# Patient Record
Sex: Female | Born: 1975 | Race: White | Hispanic: No | Marital: Married | Smoking: Former smoker
Health system: Southern US, Community
[De-identification: ages and names within clinical notes are randomized; demographics above are authoritative.]

## PROBLEM LIST (undated history)

## (undated) DIAGNOSIS — B977 Papillomavirus as the cause of diseases classified elsewhere: Secondary | ICD-10-CM

## (undated) DIAGNOSIS — A6000 Herpesviral infection of urogenital system, unspecified: Secondary | ICD-10-CM

## (undated) DIAGNOSIS — K921 Melena: Secondary | ICD-10-CM

## (undated) DIAGNOSIS — F32A Depression, unspecified: Secondary | ICD-10-CM

## (undated) DIAGNOSIS — L409 Psoriasis, unspecified: Secondary | ICD-10-CM

## (undated) DIAGNOSIS — F419 Anxiety disorder, unspecified: Secondary | ICD-10-CM

## (undated) DIAGNOSIS — F329 Major depressive disorder, single episode, unspecified: Secondary | ICD-10-CM

## (undated) HISTORY — DX: Psoriasis, unspecified: L40.9

## (undated) HISTORY — DX: Major depressive disorder, single episode, unspecified: F32.9

## (undated) HISTORY — DX: Anxiety disorder, unspecified: F41.9

## (undated) HISTORY — DX: Herpesviral infection of urogenital system, unspecified: A60.00

## (undated) HISTORY — DX: Depression, unspecified: F32.A

## (undated) HISTORY — PX: HAND TENDON SURGERY: SHX663

## (undated) HISTORY — DX: Melena: K92.1

## (undated) HISTORY — DX: Papillomavirus as the cause of diseases classified elsewhere: B97.7

## (undated) HISTORY — PX: EXCISION VAGINAL CYST: SHX5825

---

## 2014-01-16 ENCOUNTER — Other Ambulatory Visit (HOSPITAL_COMMUNITY)
Admission: RE | Admit: 2014-01-16 | Discharge: 2014-01-16 | Disposition: A | Discharge status: Home / Self Care | Payer: PRIVATE HEALTH INSURANCE | Source: Ambulatory Visit | Attending: Family Medicine | Admitting: Family Medicine

## 2014-01-16 ENCOUNTER — Encounter (INDEPENDENT_AMBULATORY_CARE_PROVIDER_SITE_OTHER): Payer: Self-pay

## 2014-01-16 ENCOUNTER — Encounter: Payer: Self-pay | Admitting: Family Medicine

## 2014-01-16 ENCOUNTER — Ambulatory Visit (INDEPENDENT_AMBULATORY_CARE_PROVIDER_SITE_OTHER): Payer: PRIVATE HEALTH INSURANCE | Admitting: Family Medicine

## 2014-01-16 VITALS — BP 102/62 | HR 83 | Temp 98.2°F | Ht 68.0 in | Wt 140.5 lb

## 2014-01-16 DIAGNOSIS — Z01419 Encounter for gynecological examination (general) (routine) without abnormal findings: Secondary | ICD-10-CM | POA: Insufficient documentation

## 2014-01-16 DIAGNOSIS — Z113 Encounter for screening for infections with a predominantly sexual mode of transmission: Secondary | ICD-10-CM | POA: Insufficient documentation

## 2014-01-16 DIAGNOSIS — Z0000 Encounter for general adult medical examination without abnormal findings: Secondary | ICD-10-CM

## 2014-01-16 DIAGNOSIS — Z1151 Encounter for screening for human papillomavirus (HPV): Secondary | ICD-10-CM | POA: Diagnosis present

## 2014-01-16 DIAGNOSIS — Z124 Encounter for screening for malignant neoplasm of cervix: Secondary | ICD-10-CM | POA: Diagnosis not present

## 2014-01-16 DIAGNOSIS — Z23 Encounter for immunization: Secondary | ICD-10-CM

## 2014-01-16 DIAGNOSIS — N760 Acute vaginitis: Secondary | ICD-10-CM | POA: Diagnosis present

## 2014-01-16 DIAGNOSIS — Z13220 Encounter for screening for lipoid disorders: Secondary | ICD-10-CM

## 2014-01-16 DIAGNOSIS — R5383 Other fatigue: Secondary | ICD-10-CM

## 2014-01-16 DIAGNOSIS — Z0189 Encounter for other specified special examinations: Secondary | ICD-10-CM

## 2014-01-16 LAB — CBC WITH DIFFERENTIAL/PLATELET
Basophils Absolute: 0 10*3/uL (ref 0.0–0.1)
Basophils Relative: 0.6 % (ref 0.0–3.0)
EOS ABS: 0.1 10*3/uL (ref 0.0–0.7)
Eosinophils Relative: 2.8 % (ref 0.0–5.0)
HEMATOCRIT: 42.4 % (ref 36.0–46.0)
HEMOGLOBIN: 14.4 g/dL (ref 12.0–15.0)
LYMPHS ABS: 1.2 10*3/uL (ref 0.7–4.0)
Lymphocytes Relative: 26.5 % (ref 12.0–46.0)
MCHC: 33.9 g/dL (ref 30.0–36.0)
MCV: 91.4 fl (ref 78.0–100.0)
MONOS PCT: 12.6 % — AB (ref 3.0–12.0)
Monocytes Absolute: 0.6 10*3/uL (ref 0.1–1.0)
NEUTROS ABS: 2.7 10*3/uL (ref 1.4–7.7)
Neutrophils Relative %: 57.5 % (ref 43.0–77.0)
Platelets: 292 10*3/uL (ref 150.0–400.0)
RBC: 4.64 Mil/uL (ref 3.87–5.11)
RDW: 12.8 % (ref 11.5–15.5)
WBC: 4.6 10*3/uL (ref 4.0–10.5)

## 2014-01-16 LAB — LIPID PANEL
Cholesterol: 146 mg/dL (ref 0–200)
HDL: 63.3 mg/dL (ref >39.00)
LDL Cholesterol: 72 mg/dL (ref 0–99)
NONHDL: 82.7
TRIGLYCERIDES: 55 mg/dL (ref 0.0–149.0)
Total CHOL/HDL Ratio: 2
VLDL: 11 mg/dL (ref 0.0–40.0)

## 2014-01-16 LAB — VITAMIN D 25 HYDROXY (VIT D DEFICIENCY, FRACTURES): VITD: 36.84 ng/mL (ref 30.00–100.00)

## 2014-01-16 LAB — COMPREHENSIVE METABOLIC PANEL
ALT: 11 U/L (ref 0–35)
AST: 19 U/L (ref 0–37)
Albumin: 4.7 g/dL (ref 3.5–5.2)
Alkaline Phosphatase: 63 U/L (ref 39–117)
BILIRUBIN TOTAL: 1.2 mg/dL (ref 0.2–1.2)
BUN: 11 mg/dL (ref 6–23)
CO2: 28 meq/L (ref 19–32)
CREATININE: 0.8 mg/dL (ref 0.4–1.2)
Calcium: 9.1 mg/dL (ref 8.4–10.5)
Chloride: 105 mEq/L (ref 96–112)
GFR: 91.44 mL/min (ref >60.00)
Glucose, Bld: 80 mg/dL (ref 70–99)
Potassium: 3.6 mEq/L (ref 3.5–5.1)
Sodium: 136 mEq/L (ref 135–145)
Total Protein: 8.1 g/dL (ref 6.0–8.3)

## 2014-01-16 LAB — VITAMIN B12: VITAMIN B 12: 355 pg/mL (ref 211–911)

## 2014-01-16 LAB — TSH: TSH: 0.53 u[IU]/mL (ref 0.35–4.50)

## 2014-01-16 NOTE — Progress Notes (Signed)
Subjective:   Patient ID: Tricia Small, female    DOB: Apr 27, 1975, 38 y.o.   MRN: [id number]  Tricia Small is a pleasant 38 y.o. year old female who presents to clinic today with Establish Care and Annual Exam  on 01/16/2014  HPI: G1P1 here to establish care and would like CPX with pap today.  H/o abnormal pap smear in 2003.   Had LEEP at that time, no abnormal pap smears since. Last pap smear done in 2011. Has a 61 year old son. Sexually active with husband only- they are considering starting a family soon.  Fatigue- has been more fatigued lately, noticing hair loss as well. No changes in her bowel habits. Very physically active- denies exercise intolerance. Denies feeling anxious.  Does have some "seasonal blues" but says she is not depressed.  No current outpatient prescriptions on file prior to visit.   No current facility-administered medications on file prior to visit.    Allergies  Allergen Reactions  . Penicillins Hives    Past Medical History  Diagnosis Date  . Blood in stool     after childbirth  . Depression   . HPV (human papilloma virus) infection   . Genital herpes   . Psoriasis     Past Surgical History  Procedure Laterality Date  . Hand tendon surgery      L thumb  . Excision vaginal cyst      Family History  Problem Relation Age of Onset  . Alcohol abuse Father   . Hypertension Father   . Diabetes Maternal Grandmother   . Cancer Maternal Grandfather   . Cancer Paternal Grandmother   . Diabetes Paternal Grandfather     History   Social History  . Marital Status: Married    Spouse Name: N/A    Number of Children: N/A  . Years of Education: N/A   Occupational History  . Not on file.   Social History Main Topics  . Smoking status: Former Games developer  . Smokeless tobacco: Never Used  . Alcohol Use: Yes  . Drug Use: No  . Sexual Activity: Yes   Other Topics Concern  . Not on file   Social History Narrative  . No narrative on  file   The PMH, PSH, Social History, Family History, Medications, and allergies have been reviewed in [facility], and have been updated if relevant.   Review of Systems  Constitutional: Positive for fatigue. Negative for fever, chills, activity change, appetite change and unexpected weight change.  Eyes: Negative.   Respiratory: Negative.   Cardiovascular: Negative.   Gastrointestinal: Negative.   Endocrine: Negative.   Genitourinary: Negative.   Musculoskeletal: Negative.   Neurological: Negative.   Hematological: Negative.   All other systems reviewed and are negative.      Objective:    BP 102/62  Pulse 83  Temp(Src) 98.2 F (36.8 C) (Oral)  Ht 5\' 8"  (1.727 m)  Wt 140 lb 8 oz (63.73 kg)  BMI 21.37 kg/m2  SpO2 98%  LMP 12/22/2013   Physical Exam    General:  Well-developed,well-nourished,in no acute distress; alert,appropriate and cooperative throughout examination Head:  normocephalic and atraumatic.   Eyes:  vision grossly intact, pupils equal, pupils round, and pupils reactive to light.   Ears:  R ear normal and L ear normal.   Nose:  no external deformity.   Mouth:  good dentition.   Neck:  No deformities, masses, or tenderness noted. Breasts:  No mass, nodules, thickening, tenderness, bulging,  retraction, inflamation, nipple discharge or skin changes noted.   Lungs:  Normal respiratory effort, chest expands symmetrically. Lungs are clear to auscultation, no crackles or wheezes. Heart:  Normal rate and regular rhythm. S1 and S2 normal without gallop, murmur, click, rub or other extra sounds. Abdomen:  Bowel sounds positive,abdomen soft and non-tender without masses, organomegaly or hernias noted. Rectal:  no external abnormalities.   Genitalia:  Pelvic Exam:        External: normal female genitalia without lesions or masses        Vagina: normal without lesions or masses        Cervix: normal without lesions or masses        Adnexa: normal bimanual exam without  masses or fullness        Uterus: normal by palpation        Pap smear: performed Msk:  No deformity or scoliosis noted of thoracic or lumbar spine.   Extremities:  No clubbing, cyanosis, edema, or deformity noted with normal full range of motion of all joints.   Neurologic:  alert & oriented X3 and gait normal.   Skin:  Intact without suspicious lesions or rashes Cervical Nodes:  No lymphadenopathy noted Axillary Nodes:  No palpable lymphadenopathy Psych:  Cognition and judgment appear intact. Alert and cooperative with normal attention span and concentration. No apparent delusions, illusions, hallucinations      Assessment & Plan:   Encounter for routine gynecological examination  Encounter for wellness examination No Follow-up on file.

## 2014-01-16 NOTE — Assessment & Plan Note (Signed)
Pap smear done today

## 2014-01-16 NOTE — Patient Instructions (Signed)
It was nice to meet you. We will call you with your results and you can view them online.

## 2014-01-16 NOTE — Assessment & Plan Note (Signed)
Reviewed preventive care protocols, scheduled due services, and updated immunizations Discussed nutrition, exercise, diet, and healthy lifestyle. Orders Placed This Encounter  Procedures  . Flu Vaccine QUAD 36+ mos PF IM (Fluarix Quad PF)  . CBC with Differential  . Comprehensive metabolic panel  . Lipid panel  . TSH  . HIV antibody (with reflex)  . RPR  . HSV(herpes simplex vrs) 1+2 ab-IgM  . Vitamin B12  . Vitamin D, 25-hydroxy

## 2014-01-16 NOTE — Progress Notes (Signed)
Pre visit review using our clinic review tool, if applicable. No additional management support is needed unless otherwise documented below in the visit note. 

## 2014-01-16 NOTE — Assessment & Plan Note (Signed)
Likely multifactorial- busy lifestyle is contributing. Sleeping well. No red flag symptoms. Will check labs today to rule out possible contributing factors.

## 2014-01-17 LAB — RPR

## 2014-01-17 LAB — CYTOLOGY - PAP

## 2014-01-17 LAB — HIV ANTIBODY (ROUTINE TESTING W REFLEX): HIV 1&2 Ab, 4th Generation: NONREACTIVE

## 2014-01-20 ENCOUNTER — Encounter: Payer: Self-pay | Admitting: *Deleted

## 2014-01-20 LAB — HSV(HERPES SIMPLEX VRS) I + II AB-IGM: HERPES SIMPLEX VRS I-IGM AB (EIA): 0.51 {index}

## 2014-01-21 LAB — CERVICOVAGINAL ANCILLARY ONLY
BACTERIAL VAGINITIS: NEGATIVE
Candida vaginitis: NEGATIVE
Herpes: NEGATIVE

## 2014-02-19 ENCOUNTER — Encounter: Payer: Self-pay | Admitting: Family Medicine

## 2014-03-11 NOTE — Telephone Encounter (Signed)
Dr. Dayton MartesAron, please see pt's email below. She is wanting codes changed and I can't change them.  Please advise. Thank you.

## 2015-12-22 ENCOUNTER — Other Ambulatory Visit: Payer: Self-pay | Admitting: Family Medicine

## 2015-12-22 ENCOUNTER — Telehealth: Payer: Self-pay | Admitting: Family Medicine

## 2015-12-22 DIAGNOSIS — Z1231 Encounter for screening mammogram for malignant neoplasm of breast: Secondary | ICD-10-CM

## 2015-12-22 NOTE — Telephone Encounter (Signed)
Pt would like to have back to back appt with her husband, for cpe ( she also wants a pap) and she is requesting mon, wed or fri, early in the am.  Is this possible? cb number is [phone] Thanks

## 2015-12-23 NOTE — Telephone Encounter (Signed)
She could come the 20th or the 25th, but it wont be until appx 10. All early morning slots have been filled

## 2016-01-13 ENCOUNTER — Ambulatory Visit: Payer: PRIVATE HEALTH INSURANCE

## 2016-01-27 ENCOUNTER — Other Ambulatory Visit (HOSPITAL_COMMUNITY)
Admission: RE | Admit: 2016-01-27 | Discharge: 2016-01-27 | Disposition: A | Discharge status: Home / Self Care | Payer: 59 | Source: Ambulatory Visit | Attending: Family Medicine | Admitting: Family Medicine

## 2016-01-27 ENCOUNTER — Encounter: Payer: Self-pay | Admitting: Family Medicine

## 2016-01-27 ENCOUNTER — Ambulatory Visit (INDEPENDENT_AMBULATORY_CARE_PROVIDER_SITE_OTHER): Payer: 59 | Admitting: Family Medicine

## 2016-01-27 VITALS — BP 104/62 | HR 86 | Temp 98.0°F | Ht 68.5 in | Wt 135.8 lb

## 2016-01-27 DIAGNOSIS — Z0000 Encounter for general adult medical examination without abnormal findings: Secondary | ICD-10-CM

## 2016-01-27 DIAGNOSIS — Z1151 Encounter for screening for human papillomavirus (HPV): Secondary | ICD-10-CM | POA: Insufficient documentation

## 2016-01-27 DIAGNOSIS — Z23 Encounter for immunization: Secondary | ICD-10-CM

## 2016-01-27 DIAGNOSIS — Z01419 Encounter for gynecological examination (general) (routine) without abnormal findings: Secondary | ICD-10-CM

## 2016-01-27 LAB — CBC WITH DIFFERENTIAL/PLATELET
Basophils Absolute: 0 10*3/uL (ref 0.0–0.1)
Basophils Relative: 0.3 % (ref 0.0–3.0)
EOS ABS: 0.1 10*3/uL (ref 0.0–0.7)
Eosinophils Relative: 1.7 % (ref 0.0–5.0)
HCT: 41.8 % (ref 36.0–46.0)
HEMOGLOBIN: 14 g/dL (ref 12.0–15.0)
LYMPHS PCT: 28.6 % (ref 12.0–46.0)
Lymphs Abs: 1.4 10*3/uL (ref 0.7–4.0)
MCHC: 33.6 g/dL (ref 30.0–36.0)
MCV: 89.2 fl (ref 78.0–100.0)
MONO ABS: 0.4 10*3/uL (ref 0.1–1.0)
Monocytes Relative: 7.3 % (ref 3.0–12.0)
NEUTROS ABS: 3.1 10*3/uL (ref 1.4–7.7)
Neutrophils Relative %: 62.1 % (ref 43.0–77.0)
PLATELETS: 331 10*3/uL (ref 150.0–400.0)
RBC: 4.69 Mil/uL (ref 3.87–5.11)
RDW: 12.4 % (ref 11.5–15.5)
WBC: 4.9 10*3/uL (ref 4.0–10.5)

## 2016-01-27 LAB — LIPID PANEL
CHOL/HDL RATIO: 2
Cholesterol: 154 mg/dL (ref 0–200)
HDL: 61.6 mg/dL (ref >39.00)
LDL CALC: 83 mg/dL (ref 0–99)
NONHDL: 91.94
TRIGLYCERIDES: 43 mg/dL (ref 0.0–149.0)
VLDL: 8.6 mg/dL (ref 0.0–40.0)

## 2016-01-27 LAB — COMPREHENSIVE METABOLIC PANEL
ALT: 15 U/L (ref 0–35)
AST: 18 U/L (ref 0–37)
Albumin: 4.3 g/dL (ref 3.5–5.2)
Alkaline Phosphatase: 113 U/L (ref 39–117)
BILIRUBIN TOTAL: 0.9 mg/dL (ref 0.2–1.2)
BUN: 12 mg/dL (ref 6–23)
CHLORIDE: 102 meq/L (ref 96–112)
CO2: 29 meq/L (ref 19–32)
CREATININE: 0.69 mg/dL (ref 0.40–1.20)
Calcium: 9.5 mg/dL (ref 8.4–10.5)
GFR: 99.65 mL/min (ref >60.00)
Glucose, Bld: 87 mg/dL (ref 70–99)
Potassium: 4 mEq/L (ref 3.5–5.1)
SODIUM: 139 meq/L (ref 135–145)
Total Protein: 7.1 g/dL (ref 6.0–8.3)

## 2016-01-27 LAB — TSH: TSH: 1.3 u[IU]/mL (ref 0.35–4.50)

## 2016-01-27 NOTE — Progress Notes (Signed)
Subjective:   Patient ID: Tricia Small, female    DOB: 06/09/1975, 40 y.o.   MRN: [id number]  Tricia Napllyson Puls is a pleasant 40 y.o. year old female who presents to clinic today with Annual Exam (with pap)  on 01/27/2016  HPI: [id number]- her 717 month old son, Lambert Ketolonzo is doing well.  Truddie HiddenLou, her oldest, is now 5 and just started kindergarten.  H/o abnormal pap smear in 2003.   Had LEEP at that time, no abnormal pap smears since. She thinks last pap was done by me in 2015 and OB did not do one when she was pregnant.  She is currently breast feeding.  Has never had a mammogram.  Sexually active with husband only.   Very physically active- denies exercise intolerance.  Denies feeling anxious.  Does have some "seasonal blues" but says she is not depressed.  No current outpatient prescriptions on file prior to visit.   No current facility-administered medications on file prior to visit.     Allergies  Allergen Reactions  . Penicillins Hives    Past Medical History:  Diagnosis Date  . Blood in stool    after childbirth  . Depression   . Genital herpes   . HPV (human papilloma virus) infection   . Psoriasis     Past Surgical History:  Procedure Laterality Date  . EXCISION VAGINAL CYST    . HAND TENDON SURGERY     L thumb    Family History  Problem Relation Age of Onset  . Alcohol abuse Father   . Hypertension Father   . Diabetes Maternal Grandmother   . Cancer Maternal Grandfather   . Cancer Paternal Grandmother   . Diabetes Paternal Grandfather     Social History   Social History  . Marital status: Married    Spouse name: N/A  . Number of children: N/A  . Years of education: N/A   Occupational History  . Not on file.   Social History Main Topics  . Smoking status: Former Games developermoker  . Smokeless tobacco: Never Used  . Alcohol use Yes  . Drug use: No  . Sexual activity: Yes   Other Topics Concern  . Not on file   Social History Narrative  . No narrative  on file   The PMH, PSH, Social History, Family History, Medications, and allergies [facility], and have been updated if relevant.   Review of Systems  Constitutional: Positive for fatigue. Negative for activity change, appetite change, chills, fever and unexpected weight change.  Eyes: Negative.   Respiratory: Negative.   Cardiovascular: Negative.   Gastrointestinal: Negative.   Endocrine: Negative.   Genitourinary: Negative.   Musculoskeletal: Negative.   Neurological: Negative.   Hematological: Negative.   All other systems reviewed and are negative.      Objective:    BP 104/62   Pulse 86   Temp 98 F (36.7 C) (Oral)   Ht 5' 8.5" (1.74 m)   Wt 135 lb 12 oz (61.6 kg)   LMP 01/21/2016   SpO2 98%   BMI 20.34 kg/m    Physical Exam  General:  Well-developed,well-nourished,in no acute distress; alert,appropriate and cooperative throughout examination Head:  normocephalic and atraumatic.   Eyes:  vision grossly intact, pupils equal, pupils round, and pupils reactive to light.   Ears:  R ear normal and L ear normal.   Nose:  no external deformity.   Mouth:  good dentition.   Neck:  No deformities,  masses, or tenderness noted. Breasts:  No mass, nodules, thickening, tenderness, bulging, retraction, inflamation, nipple discharge or skin changes noted.  Lungs:  Normal respiratory effort, chest expands symmetrically. Lungs are clear to auscultation, no crackles or wheezes. Heart:  Normal rate and regular rhythm. S1 and S2 normal without gallop, murmur, click, rub or other extra sounds. Abdomen:  Bowel sounds positive,abdomen soft and non-tender without masses, organomegaly or hernias noted. Rectal:  no external abnormalities.   Genitalia:  Pelvic Exam:        External: normal female genitalia without lesions or masses        Vagina: normal without lesions or masses        Cervix: normal without lesions or masses        Adnexa: normal bimanual exam without  masses or fullness        Uterus: normal by palpation        Pap smear: performed Msk:  No deformity or scoliosis noted of thoracic or lumbar spine.   Extremities:  No clubbing, cyanosis, edema, or deformity noted with normal full range of motion of all joints.   Neurologic:  alert & oriented X3 and gait normal.   Skin:  Intact without suspicious lesions or rashes Cervical Nodes:  No lymphadenopathy noted Axillary Nodes:  No palpable lymphadenopathy Psych:  Cognition and judgment appear intact. Alert and cooperative with normal attention span and concentration. No apparent delusions, illusions, hallucinations       Assessment & Plan:   Encounter for wellness examination - Plan: CBC with Differential/Platelet, Comprehensive metabolic panel, Lipid panel, TSH  Encounter for gynecological examination without abnormal finding No Follow-up on file.

## 2016-01-27 NOTE — Progress Notes (Signed)
Pre visit review using our clinic review tool, if applicable. No additional management support is needed unless otherwise documented below in the visit note. 

## 2016-01-27 NOTE — Assessment & Plan Note (Addendum)
Pap smear done today

## 2016-01-27 NOTE — Patient Instructions (Signed)
Great to see you. Happy birthday!  We will call you with your lab results and you can view them online. 

## 2016-01-27 NOTE — Assessment & Plan Note (Signed)
Reviewed preventive care protocols, scheduled due services, and updated immunizations Discussed nutrition, exercise, diet, and healthy lifestyle.  

## 2016-01-27 NOTE — Addendum Note (Signed)
Addended by: Desmond DikeKNIGHT, Puanani Gene H on: 01/27/2016 08:53 AM   Modules accepted: Orders

## 2016-01-29 LAB — CYTOLOGY - PAP

## 2016-02-01 ENCOUNTER — Encounter: Payer: Self-pay | Admitting: *Deleted

## 2016-03-29 ENCOUNTER — Encounter: Payer: Self-pay | Admitting: Family Medicine

## 2016-03-30 ENCOUNTER — Encounter: Payer: Self-pay | Admitting: *Deleted

## 2016-05-06 ENCOUNTER — Ambulatory Visit (INDEPENDENT_AMBULATORY_CARE_PROVIDER_SITE_OTHER): Payer: 59 | Admitting: Family Medicine

## 2016-05-06 ENCOUNTER — Encounter: Payer: Self-pay | Admitting: Family Medicine

## 2016-05-06 VITALS — BP 112/60 | HR 81 | Temp 99.0°F | Wt 147.8 lb

## 2016-05-06 DIAGNOSIS — H9202 Otalgia, left ear: Secondary | ICD-10-CM | POA: Diagnosis not present

## 2016-05-06 NOTE — Progress Notes (Signed)
Subjective:    Patient ID: Tricia Small, female    DOB: 04/09/1976, 41 y.o.   MRN: [id number]  HPI This is a 41 yo female who presents today with left ear pain x 5-6 days. Has a history of excessive wax and has used over the counter ear wax removal products with gradual relief. She has been applying warm packs to ear with some relief. She is currently [redacted] weeks pregnant and is breastfeeding her 4710 month old. She strongly prefers to not use antibiotics or systemic medications including acetaminophen. Her 735 yo son has recently been in the hospital from complications of flu (pharyngeal abscess). The patient denies fever or chills, has had clear nasal drainage for a couple of days. No myalgias. No headache. No cough. She had some nausea early in her pregnancy, but none recently. Good appetite. Taking daily otc prenatal vitamin. Has ob care.   Past Medical History:  Diagnosis Date  . Blood in stool    after childbirth  . Depression   . Genital herpes   . HPV (human papilloma virus) infection   . Psoriasis    Past Surgical History:  Procedure Laterality Date  . EXCISION VAGINAL CYST    . HAND TENDON SURGERY     L thumb   Family History  Problem Relation Age of Onset  . Alcohol abuse Father   . Hypertension Father   . Diabetes Maternal Grandmother   . Cancer Maternal Grandfather   . Cancer Paternal Grandmother   . Diabetes Paternal Grandfather    Social History  Substance Use Topics  . Smoking status: Former Games developermoker  . Smokeless tobacco: Never Used  . Alcohol use Yes     Review of Systems Per HPI    Objective:   Physical Exam  Constitutional: She is oriented to person, place, and time. She appears well-developed and well-nourished. No distress.  HENT:  Head: Normocephalic and atraumatic.  Right Ear: Tympanic membrane, external ear and ear canal normal.  Left Ear: Tympanic membrane, external ear and ear canal normal.  Nose: Rhinorrhea present. Right sinus exhibits no  maxillary sinus tenderness and no frontal sinus tenderness. Left sinus exhibits no maxillary sinus tenderness and no frontal sinus tenderness.  Mouth/Throat: Oropharynx is clear and moist.  Moderate amount flaky, light yellow cerumen. Part of TM visible is shiny grey, no erythema. No swelling of canal, mildly uncomfortable to exam. No preauricular tenderness or pain with opening/closing of jaw. Auricular tubercle with small area erythema- patient reports this is new, possibly from applying heat to area.   Eyes: Conjunctivae are normal.  Neck: Normal range of motion. Neck supple.  Cardiovascular: Normal rate, regular rhythm and normal heart sounds.   Pulmonary/Chest: Effort normal and breath sounds normal.  Lymphadenopathy:    She has no cervical adenopathy.  Neurological: She is alert and oriented to person, place, and time.  Skin: Skin is warm and dry. She is not diaphoretic.  Psychiatric: She has a normal mood and affect. Her behavior is normal. Judgment and thought content normal.  Vitals reviewed.        BP 112/60 (BP Location: Right Arm, Patient Position: Sitting, Cuff Size: Normal)   Pulse 81   Temp 99 F (37.2 C) (Oral)   Wt 147 lb 12.8 oz (67 kg)   LMP 02/21/2016   SpO2 97%   BMI 22.15 kg/m  Wt Readings from Last 3 Encounters:  05/06/16 147 lb 12.8 oz (67 kg)  01/27/16 135 lb 12 oz (61.6  kg)  01/16/14 140 lb 8 oz (63.7 kg)    Assessment & Plan:  1. Otalgia, left ear - Provided written and verbal information regarding diagnosis and treatment. - patient with moderate cerumen, encouraged her to use small amount mineral oil daily for next several days to loosen, can continue warm compresses, discussed risks vs benefits of low dose acetaminophen use during first trimester of pregnancy - RTC precautions reviewed- worsening pain, fever  Olean Ree, FNP-BC  [facility], [facility]  05/06/2016 9:09 PM

## 2016-05-06 NOTE — Patient Instructions (Signed)
Try a drop of mineral oil every week For irrigation can use Elephant Ear system  Earache, Adult An earache, or ear pain, can be caused by many things, including:  An infection.  Ear wax buildup.  Ear pressure.  Something in the ear that should not be there (foreign body).  A sore throat.  Tooth problems.  Jaw problems. Treatment of the earache will depend on the cause. If the cause is not clear or cannot be determined, you may need to watch your symptoms until your earache goes away or until a cause is found. Follow these instructions at home: Pay attention to any changes in your symptoms. Take these actions to help with your pain:  Take or apply over-the-counter and prescription medicines only as told by your health care provider.  If you were prescribed an antibiotic medicine, use it as told by your health care provider. Do not stop using the antibiotic even if you start to feel better.  Do not put anything in your ear other than medicine that is prescribed by your health care provider.  If directed, apply heat to the affected area as often as told by your health care provider. Use the heat source that your health care provider recommends, such as a moist heat pack or a heating pad.  Place a towel between your skin and the heat source.  Leave the heat on for 20-30 minutes.  Remove the heat if your skin turns bright red. This is especially important if you are unable to feel pain, heat, or cold. You may have a greater risk of getting burned.  If directed, put ice on the ear:  Put ice in a plastic bag.  Place a towel between your skin and the bag.  Leave the ice on for 20 minutes, 2-3 times a day.  Try resting in an upright position instead of lying down. This may help to reduce pressure in your ear and relieve pain.  Chew gum if it helps to relieve your ear pain.  Treat any allergies as told by your health care provider.  Keep all follow-up visits as told by your  health care provider. This is important. Contact a health care provider if:  Your pain does not improve within 2 days.  Your earache gets worse.  You have new symptoms.  You have a fever. Get help right away if:  You have a severe headache.  You have a stiff neck.  You have trouble swallowing.  You have redness or swelling behind your ear.  You have fluid or blood coming from your ear.  You have hearing loss.  You feel dizzy. This information is not intended to replace advice given to you by your health care provider. Make sure you discuss any questions you have with your health care provider. Document Released: 11/20/2003 Document Revised: 12/01/2015 Document Reviewed: 09/28/2015 Elsevier Interactive Patient Education  2017 [employer]

## 2016-05-06 NOTE — Progress Notes (Signed)
Pre visit review using our clinic review tool, if applicable. No additional management support is needed unless otherwise documented below in the visit note. 

## 2017-01-19 ENCOUNTER — Encounter: Payer: 59 | Admitting: Family Medicine

## 2017-01-19 ENCOUNTER — Ambulatory Visit (INDEPENDENT_AMBULATORY_CARE_PROVIDER_SITE_OTHER): Payer: 59 | Admitting: Family Medicine

## 2017-01-19 ENCOUNTER — Encounter: Payer: Self-pay | Admitting: Family Medicine

## 2017-01-19 VITALS — BP 110/64 | HR 67 | Temp 97.9°F | Ht 68.0 in | Wt 160.0 lb

## 2017-01-19 DIAGNOSIS — Z23 Encounter for immunization: Secondary | ICD-10-CM | POA: Diagnosis not present

## 2017-01-19 DIAGNOSIS — Z0000 Encounter for general adult medical examination without abnormal findings: Secondary | ICD-10-CM

## 2017-01-19 LAB — CBC WITH DIFFERENTIAL/PLATELET
Basophils Absolute: 0 10*3/uL (ref 0.0–0.1)
Basophils Relative: 0.5 % (ref 0.0–3.0)
EOS ABS: 0.1 10*3/uL (ref 0.0–0.7)
Eosinophils Relative: 2.2 % (ref 0.0–5.0)
HEMATOCRIT: 39.7 % (ref 36.0–46.0)
HEMOGLOBIN: 13.4 g/dL (ref 12.0–15.0)
LYMPHS PCT: 25.6 % (ref 12.0–46.0)
Lymphs Abs: 1.7 10*3/uL (ref 0.7–4.0)
MCHC: 33.7 g/dL (ref 30.0–36.0)
MCV: 91.1 fl (ref 78.0–100.0)
MONO ABS: 0.4 10*3/uL (ref 0.1–1.0)
Monocytes Relative: 6.9 % (ref 3.0–12.0)
Neutro Abs: 4.2 10*3/uL (ref 1.4–7.7)
Neutrophils Relative %: 64.8 % (ref 43.0–77.0)
Platelets: 319 10*3/uL (ref 150.0–400.0)
RBC: 4.35 Mil/uL (ref 3.87–5.11)
RDW: 11.4 % — ABNORMAL LOW (ref 11.5–15.5)
WBC: 6.5 10*3/uL (ref 4.0–10.5)

## 2017-01-19 LAB — LIPID PANEL
CHOLESTEROL: 161 mg/dL (ref 0–200)
HDL: 53.6 mg/dL (ref >39.00)
LDL Cholesterol: 91 mg/dL (ref 0–99)
NonHDL: 107.58
Total CHOL/HDL Ratio: 3
Triglycerides: 83 mg/dL (ref 0.0–149.0)
VLDL: 16.6 mg/dL (ref 0.0–40.0)

## 2017-01-19 LAB — COMPREHENSIVE METABOLIC PANEL
ALBUMIN: 4.3 g/dL (ref 3.5–5.2)
ALK PHOS: 82 U/L (ref 39–117)
ALT: 13 U/L (ref 0–35)
AST: 17 U/L (ref 0–37)
BUN: 9 mg/dL (ref 6–23)
CALCIUM: 9.7 mg/dL (ref 8.4–10.5)
CO2: 24 mEq/L (ref 19–32)
Chloride: 99 mEq/L (ref 96–112)
Creatinine, Ser: 0.62 mg/dL (ref 0.40–1.20)
GFR: 112.2 mL/min (ref >60.00)
GLUCOSE: 81 mg/dL (ref 70–99)
POTASSIUM: 3.8 meq/L (ref 3.5–5.1)
SODIUM: 131 meq/L — AB (ref 135–145)
TOTAL PROTEIN: 7 g/dL (ref 6.0–8.3)
Total Bilirubin: 0.6 mg/dL (ref 0.2–1.2)

## 2017-01-19 LAB — TSH: TSH: 1.18 u[IU]/mL (ref 0.35–4.50)

## 2017-01-19 NOTE — Progress Notes (Signed)
Subjective:   Patient ID: Tricia Small, female    DOB: August 09, 1975, 41 y.o.   MRN: [id number]  Tricia Small is a pleasant 41 y.o. year old female who presents to clinic today with Annual Exam  on 01/19/2017  HPI:  G3P3- [name], age 42 months ago, [name] (18 months) and [name] (42 years old) are all doing well.  She is working from home.   Denies any symptoms of post partum depression.  H/o abnormal pap smear in 2003.   Had LEEP at that time, no abnormal pap smears since.  She is currently breast feeding.    Sexually active with husband only.  Current Outpatient Prescriptions on File Prior to Visit  Medication Sig Dispense Refill  . Prenatal Multivit-Min-Fe-FA (PRENATAL VITAMINS PO) Take 1 tablet by mouth daily.     No current facility-administered medications on file prior to visit.     Allergies  Allergen Reactions  . Penicillins Hives    Past Medical History:  Diagnosis Date  . Blood in stool    after childbirth  . Depression   . Genital herpes   . HPV (human papilloma virus) infection   . Psoriasis     Past Surgical History:  Procedure Laterality Date  . EXCISION VAGINAL CYST    . HAND TENDON SURGERY     L thumb    Family History  Problem Relation Age of Onset  . Alcohol abuse Father   . Hypertension Father   . Cancer Maternal Grandfather   . Diabetes Maternal Grandmother   . Cancer Paternal Grandmother   . Diabetes Paternal Grandfather     Social History   Social History  . Marital status: Married    Spouse name: N/A  . Number of children: N/A  . Years of education: N/A   Occupational History  . Not on file.   Social History Main Topics  . Smoking status: Former Games developer  . Smokeless tobacco: Never Used  . Alcohol use Yes  . Drug use: No  . Sexual activity: Yes   Other Topics Concern  . Not on file   Social History Narrative  . No narrative on file   The PMH, PSH, Social History, Family History, Medications, and allergies have  been reviewed in [facility], and have been updated if relevant.  Review of Systems  Constitutional: Negative.   HENT: Negative.   Eyes: Negative.   Respiratory: Negative.   Cardiovascular: Negative.   Gastrointestinal: Negative.   Endocrine: Negative.   Genitourinary: Negative.   Musculoskeletal: Negative.   Allergic/Immunologic: Negative.   Neurological: Negative.   Hematological: Negative.   All other systems reviewed and are negative.      Objective:    Pulse 67   Temp 97.9 F (36.6 C) (Oral)   Ht  (1.727 m)   Wt 160 lb (72.6 kg)   LMP 02/21/2016   SpO2 98%   Breastfeeding? Unknown   BMI 24.33 kg/m    Physical Exam   General:  Well-developed,well-nourished,in no acute distress; alert,appropriate and cooperative throughout examination Head:  normocephalic and atraumatic.   Eyes:  vision grossly intact, PERRL Ears:  R ear normal and L ear normal externally, TMs clear bilaterally Nose:  no external deformity.   Mouth:  good dentition.   Neck:  No deformities, masses, or tenderness noted. Lungs:  Normal respiratory effort, chest expands symmetrically. Lungs are clear to auscultation, no crackles or wheezes. Heart:  Normal rate and regular rhythm. S1 and S2 normal without  gallop, murmur, click, rub or other extra sounds. Abdomen:  Bowel sounds positive,abdomen soft and non-tender without masses, organomegaly or hernias noted. Msk:  No deformity or scoliosis noted of thoracic or lumbar spine.   Extremities:  No clubbing, cyanosis, edema, or deformity noted with normal full range of motion of all joints.   Neurologic:  alert & oriented X3 and gait normal.   Skin:  Intact without suspicious lesions or rashes Cervical Nodes:  No lymphadenopathy noted Axillary Nodes:  No palpable lymphadenopathy Psych:  Cognition and judgment appear intact. Alert and cooperative with normal attention span and concentration. No apparent delusions, illusions, hallucinations         Assessment & Plan:   Encounter for wellness examination - Plan: CBC with Differential/Platelet, Comprehensive metabolic panel, Lipid panel, TSH No Follow-up on file.

## 2017-01-19 NOTE — Assessment & Plan Note (Signed)
Reviewed preventive care protocols, scheduled due services, and updated immunizations Discussed nutrition, exercise, diet, and healthy lifestyle..  Orders Placed This Encounter  Procedures  . Flu Vaccine QUAD 6+ mos PF IM (Fluarix Quad PF)  . CBC with Differential/Platelet  . Comprehensive metabolic panel  . Lipid panel  . TSH

## 2017-01-23 ENCOUNTER — Telehealth: Payer: Self-pay | Admitting: *Deleted

## 2017-01-23 NOTE — Telephone Encounter (Signed)
Left patient message to pick up wellness care forms. Ready to be picked up front.

## 2017-01-25 ENCOUNTER — Encounter: Payer: Self-pay | Admitting: Family Medicine

## 2017-01-26 ENCOUNTER — Encounter: Payer: 59 | Admitting: Family Medicine

## 2017-01-30 ENCOUNTER — Encounter: Payer: Self-pay | Admitting: Family Medicine

## 2017-09-12 ENCOUNTER — Ambulatory Visit: Payer: Self-pay | Admitting: *Deleted

## 2017-09-12 NOTE — Telephone Encounter (Signed)
Called with CP but with our conversation she says "soreness" above Rt breast. She is currently breastfeeding but has questions regarding a mammogram. Reports she thinks she overextended her arm about one month ago and started feeling this soreness shortly afterwards. Describes it as intermittent, dull soreness noticed with exertion. Has been more aware of the discomfort for about 2 weeks. Appointment made for tomorrow.  Reason for Disposition . [1] MILD pain AND [2] present > 7 days  Answer Assessment - Initial Assessment Questions 1. LOCATION: "Where does it hurt?"       Upper Rt breast area. 2. RADIATION: "Does the pain go anywhere else?" (e.g., into neck, jaw, arms, back)    Does not radiate but the soreness feels deep inside the tissue. 3. ONSET: "When did the chest pain begin?" (Minutes, hours or days)      Feels a dull sore ache for about one month now. 4. PATTERN "Does the pain come and go, or has it been constant since it started?"  "Does it get worse with exertion?"      Comes and goes with exertion. 5. DURATION: "How long does it last" (e.g., seconds, minutes, hours)     Minimal time when she uses the arm 6. SEVERITY: "How bad is the pain?"  (e.g., Scale 1-10; mild, moderate, or severe)    - MILD (1-3): doesn't interfere with normal activities     - MODERATE (4-7): interferes with normal activities or awakens from sleep    - SEVERE (8-10): excruciating pain, unable to do any normal activities       mild 7. CARDIAC RISK FACTORS: "Do you have any history of heart problems or risk factors for heart disease?" (e.g., prior heart attack, angina; high blood pressure, diabetes, being overweight, high cholesterol, smoking, or strong family history of heart disease)     no 8. PULMONARY RISK FACTORS: "Do you have any history of lung disease?"  (e.g., blood clots in lung, asthma, emphysema, birth control pills)     no 9. CAUSE: "What do you think is causing the chest pain?"     Thinks she  over extended her arm about a month ago. 10. OTHER SYMPTOMS: "Do you have any other symptoms?" (e.g., dizziness, nausea, vomiting, sweating, fever, difficulty breathing, cough)       no 11. PREGNANCY: "Is there any chance you are pregnant?" "When was your last menstrual period?"       Breastfeeding.  Protocols used: CHEST INJURY - BENDING, LIFTING, OR TWISTING-A-AH, CHEST PAIN-A-AH

## 2017-09-13 ENCOUNTER — Other Ambulatory Visit: Payer: Self-pay | Admitting: Family Medicine

## 2017-09-13 ENCOUNTER — Ambulatory Visit: Payer: 59 | Admitting: Family Medicine

## 2017-09-13 ENCOUNTER — Encounter: Payer: Self-pay | Admitting: Family Medicine

## 2017-09-13 VITALS — BP 98/72 | HR 88 | Temp 98.6°F | Ht 68.0 in | Wt 139.2 lb

## 2017-09-13 DIAGNOSIS — N644 Mastodynia: Secondary | ICD-10-CM | POA: Diagnosis not present

## 2017-09-13 DIAGNOSIS — R5383 Other fatigue: Secondary | ICD-10-CM | POA: Diagnosis not present

## 2017-09-13 LAB — TSH: TSH: 1.91 u[IU]/mL (ref 0.35–4.50)

## 2017-09-13 LAB — CBC WITH DIFFERENTIAL/PLATELET
BASOS PCT: 0.9 % (ref 0.0–3.0)
Basophils Absolute: 0 10*3/uL (ref 0.0–0.1)
EOS PCT: 2.5 % (ref 0.0–5.0)
Eosinophils Absolute: 0.1 10*3/uL (ref 0.0–0.7)
HCT: 39.9 % (ref 36.0–46.0)
HEMOGLOBIN: 13.5 g/dL (ref 12.0–15.0)
Lymphocytes Relative: 21.7 % (ref 12.0–46.0)
Lymphs Abs: 1.1 10*3/uL (ref 0.7–4.0)
MCHC: 33.8 g/dL (ref 30.0–36.0)
MCV: 88.3 fl (ref 78.0–100.0)
MONO ABS: 0.4 10*3/uL (ref 0.1–1.0)
Monocytes Relative: 8.5 % (ref 3.0–12.0)
Neutro Abs: 3.4 10*3/uL (ref 1.4–7.7)
Neutrophils Relative %: 66.4 % (ref 43.0–77.0)
Platelets: 321 10*3/uL (ref 150.0–400.0)
RBC: 4.52 Mil/uL (ref 3.87–5.11)
RDW: 12.2 % (ref 11.5–15.5)
WBC: 5.1 10*3/uL (ref 4.0–10.5)

## 2017-09-13 LAB — FERRITIN: Ferritin: 33.6 ng/mL (ref 10.0–291.0)

## 2017-09-13 LAB — VITAMIN B12: Vitamin B-12: 549 pg/mL (ref 211–911)

## 2017-09-13 LAB — VITAMIN D 25 HYDROXY (VIT D DEFICIENCY, FRACTURES): VITD: 34.23 ng/mL (ref 30.00–100.00)

## 2017-09-13 NOTE — Assessment & Plan Note (Signed)
Ongoing for weeks now and she is very concerned as she has never had a mammogram since she has been either pregnant or breast feeding for past 3 years.  No family h/o breast CA but her grandmother did die of "bone cancer." Will go ahead and order breast imaging- [place] and mammogram and advised her to discuss with breast center in terms of accuracy of breast imaging while breast feeding. The patient indicates understanding of these issues and agrees with the plan.

## 2017-09-13 NOTE — Patient Instructions (Addendum)
Great to see you. Please call [facility] to schedule your mammogram/ultrasound like we discussed today.

## 2017-09-13 NOTE — Assessment & Plan Note (Signed)
Likely multifactorial. Will check labs today for work up. The patient indicates understanding of these issues and agrees with the plan. Orders Placed This Encounter  Procedures  . MM Digital Diagnostic Unilat R  . US BREAST COMPLETE UNI RIGHT INC AXILLA  . Vitamin D (25 hydroxy)  . CBC with Differential/Platelet  . TSH  . B12  . Ferritin

## 2017-09-13 NOTE — Progress Notes (Signed)
Subjective:   Patient ID: Tricia Small, female    DOB: [birth year], 42 y.o.   MRN: [id number]  Tricia Small is a pleasant 42 y.o. year old female who presents to clinic today with Breast Pain (deep ongoing pain on right breast bone, hasnt been able to get a mammogram since she been pregnant for last three years with breast feeding. afraid that something is wrong and would like a thorough breast exam. Did break right collarbone 20 years ago.)  on 09/13/2017  HPI: Right breast soreness-  Has never had a mammogram since she has been pregnant or breast feeding.  She is currently breast feeding now as well.  She has had a deep, soreness right breast area for past 2 weeks.  Pain is intermittent.  She thinks she may have over extended her arm about a month ago but she wants a thorough breast exam to make sure it is not coming from her breast.  She also feels she has been quite fatigued lately despite the baby sleeping better.  Also bruising easily.  Current Outpatient Medications on File Prior to Visit  Medication Sig Dispense Refill  . docusate sodium (COLACE) 100 MG capsule     . Prenatal Multivit-Min-Fe-FA (PRENATAL VITAMINS PO) Take 1 tablet by mouth daily.    . Prenatal Vit-Fe Fumarate-FA (PNV PRENATAL PLUS MULTIVITAMIN) 27-1 MG TABS Take by mouth.    Marland Kitchen ibuprofen (ADVIL,MOTRIN) 800 MG tablet Take 800 mg by mouth every 8 (eight) hours as needed.  0   No current facility-administered medications on file prior to visit.     Allergies  Allergen Reactions  . Penicillins Hives    Past Medical History:  Diagnosis Date  . Blood in stool    after childbirth  . Depression   . Genital herpes   . HPV (human papilloma virus) infection   . Psoriasis     Past Surgical History:  Procedure Laterality Date  . EXCISION VAGINAL CYST    . HAND TENDON SURGERY     L thumb    Family History  Problem Relation Age of Onset  . Alcohol abuse Father   . Hypertension Father   . Cancer  Maternal Grandfather   . Diabetes Maternal Grandmother   . Cancer Paternal Grandmother   . Diabetes Paternal Grandfather     Social History   Socioeconomic History  . Marital status: Married    Spouse name: Not on file  . Number of children: Not on file  . Years of education: Not on file  . Highest education level: Not on file  Occupational History  . Not on file  Social Needs  . Financial resource strain: Not on file  . Food insecurity:    Worry: Not on file    Inability: Not on file  . Transportation needs:    Medical: Not on file    Non-medical: Not on file  Tobacco Use  . Smoking status: Former Games developer  . Smokeless tobacco: Never Used  Substance and Sexual Activity  . Alcohol use: Yes  . Drug use: No  . Sexual activity: Yes  Lifestyle  . Physical activity:    Days per week: Not on file    Minutes per session: Not on file  . Stress: Not on file  Relationships  . Social connections:    Talks on phone: Not on file    Gets together: Not on file    Attends religious service: Not on file    Active member  of club or organization: Not on file    Attends meetings of clubs or organizations: Not on file    Relationship status: Not on file  . Intimate partner violence:    Fear of current or ex partner: Not on file    Emotionally abused: Not on file    Physically abused: Not on file    Forced sexual activity: Not on file  Other Topics Concern  . Not on file  Social History Narrative  . Not on file   The PMH, PSH, Social History, Family History, Medications, and allergies have been reviewed in [facility], and have been updated if relevant.    Review of Systems  Constitutional: Positive for fatigue. Negative for activity change, appetite change, fever and unexpected weight change.  Respiratory: Negative.   Cardiovascular:       + right breast pain  Gastrointestinal: Negative.   Skin: Negative.   Neurological: Negative.   Hematological: Negative for adenopathy.  Bruises/bleeds easily.  Psychiatric/Behavioral: Negative.   All other systems reviewed and are negative.      Objective:    BP 98/72 (BP Location: Left Arm, Patient Position: Sitting, Cuff Size: Normal)   Pulse 88   Temp 98.6 F (37 C) (Oral)   Ht  (1.727 m)   Wt 139 lb 3.2 oz (63.1 kg)   SpO2 98%   BMI 21.17 kg/m   Wt Readings from Last 3 Encounters:  09/13/17 139 lb 3.2 oz (63.1 kg)  01/19/17 160 lb (72.6 kg)  05/06/16 147 lb 12.8 oz (67 kg)     Physical Exam  Constitutional: She is oriented to person, place, and time. She appears well-developed and well-nourished. No distress.  HENT:  Head: Normocephalic and atraumatic.  Eyes: EOM are normal.  Cardiovascular: Normal rate.  Pulmonary/Chest: Effort normal. Right breast exhibits mass. Right breast exhibits no inverted nipple, no nipple discharge, no skin change and no tenderness. Left breast exhibits no inverted nipple, no mass, no nipple discharge, no skin change and no tenderness. No breast swelling, tenderness or discharge.    Lymphadenopathy:    She has no axillary adenopathy.  Neurological: She is alert and oriented to person, place, and time. No cranial nerve deficit.  Skin: Skin is warm and dry. She is not diaphoretic.  Psychiatric: She has a normal mood and affect. Her behavior is normal. Judgment and thought content normal.  Nursing note and vitals reviewed.         Assessment & Plan:   Breast pain, right No follow-ups on file.

## 2017-09-20 ENCOUNTER — Ambulatory Visit
Admission: RE | Admit: 2017-09-20 | Discharge: 2017-09-20 | Disposition: A | Discharge status: Home / Self Care | Payer: 59 | Source: Ambulatory Visit | Attending: Family Medicine | Admitting: Family Medicine

## 2017-09-20 DIAGNOSIS — N644 Mastodynia: Secondary | ICD-10-CM | POA: Insufficient documentation

## 2019-09-09 ENCOUNTER — Other Ambulatory Visit: Payer: Self-pay | Admitting: Family Medicine

## 2019-09-09 DIAGNOSIS — Z1231 Encounter for screening mammogram for malignant neoplasm of breast: Secondary | ICD-10-CM

## 2019-09-27 ENCOUNTER — Ambulatory Visit
Admission: RE | Admit: 2019-09-27 | Discharge: 2019-09-27 | Disposition: A | Discharge status: Home / Self Care | Payer: 59 | Source: Ambulatory Visit | Attending: Family Medicine | Admitting: Family Medicine

## 2019-09-27 DIAGNOSIS — Z1231 Encounter for screening mammogram for malignant neoplasm of breast: Secondary | ICD-10-CM | POA: Diagnosis present

## 2021-03-17 ENCOUNTER — Other Ambulatory Visit: Payer: Self-pay | Admitting: Family Medicine

## 2021-03-17 DIAGNOSIS — Z1231 Encounter for screening mammogram for malignant neoplasm of breast: Secondary | ICD-10-CM

## 2021-05-21 ENCOUNTER — Other Ambulatory Visit: Payer: Self-pay | Admitting: Family Medicine

## 2021-05-21 ENCOUNTER — Other Ambulatory Visit: Payer: Self-pay

## 2021-05-21 ENCOUNTER — Ambulatory Visit
Admission: RE | Admit: 2021-05-21 | Discharge: 2021-05-21 | Disposition: A | Discharge status: Home / Self Care | Payer: 59 | Source: Ambulatory Visit | Attending: Family Medicine | Admitting: Family Medicine

## 2021-05-21 DIAGNOSIS — Z1231 Encounter for screening mammogram for malignant neoplasm of breast: Secondary | ICD-10-CM | POA: Insufficient documentation

## 2021-05-27 ENCOUNTER — Other Ambulatory Visit: Payer: Self-pay | Admitting: Family Medicine

## 2021-05-27 DIAGNOSIS — R928 Other abnormal and inconclusive findings on diagnostic imaging of breast: Secondary | ICD-10-CM

## 2021-05-27 DIAGNOSIS — N6489 Other specified disorders of breast: Secondary | ICD-10-CM

## 2021-05-31 ENCOUNTER — Other Ambulatory Visit: Payer: Self-pay

## 2021-05-31 ENCOUNTER — Ambulatory Visit
Admission: RE | Admit: 2021-05-31 | Discharge: 2021-05-31 | Disposition: A | Discharge status: Home / Self Care | Payer: 59 | Source: Ambulatory Visit | Attending: Family Medicine | Admitting: Family Medicine

## 2021-05-31 DIAGNOSIS — N6489 Other specified disorders of breast: Secondary | ICD-10-CM | POA: Insufficient documentation

## 2021-05-31 DIAGNOSIS — R928 Other abnormal and inconclusive findings on diagnostic imaging of breast: Secondary | ICD-10-CM | POA: Diagnosis present

## 2022-08-08 ENCOUNTER — Other Ambulatory Visit: Payer: Self-pay | Admitting: Family Medicine

## 2022-08-08 DIAGNOSIS — Z1231 Encounter for screening mammogram for malignant neoplasm of breast: Secondary | ICD-10-CM

## 2022-09-07 ENCOUNTER — Ambulatory Visit
Admission: RE | Admit: 2022-09-07 | Discharge: 2022-09-07 | Disposition: A | Discharge status: Home / Self Care | Payer: 59 | Source: Ambulatory Visit | Attending: Family Medicine | Admitting: Family Medicine

## 2022-09-07 DIAGNOSIS — Z1231 Encounter for screening mammogram for malignant neoplasm of breast: Secondary | ICD-10-CM | POA: Diagnosis not present

## 2023-04-10 LAB — LAB REPORT - SCANNED: EGFR: 112

## 2023-05-08 ENCOUNTER — Encounter: Payer: Self-pay | Admitting: *Deleted

## 2023-07-03 IMAGING — MG MM DIGITAL DIAGNOSTIC UNILAT*L* W/ TOMO W/ CAD
6 series · 6 of 18 positions shown · non-contrast
Comparison: Previous exam(s).

CLINICAL DATA: Screening recall for a left breast asymmetry.

EXAM:
DIGITAL DIAGNOSTIC UNILATERAL LEFT MAMMOGRAM WITH TOMOSYNTHESIS AND
CAD
TECHNIQUE: Left digital diagnostic mammography and breast tomosynthesis was
performed. The images were evaluated with computer-aided detection.

[L MLO synth-2D (1 of 2)]
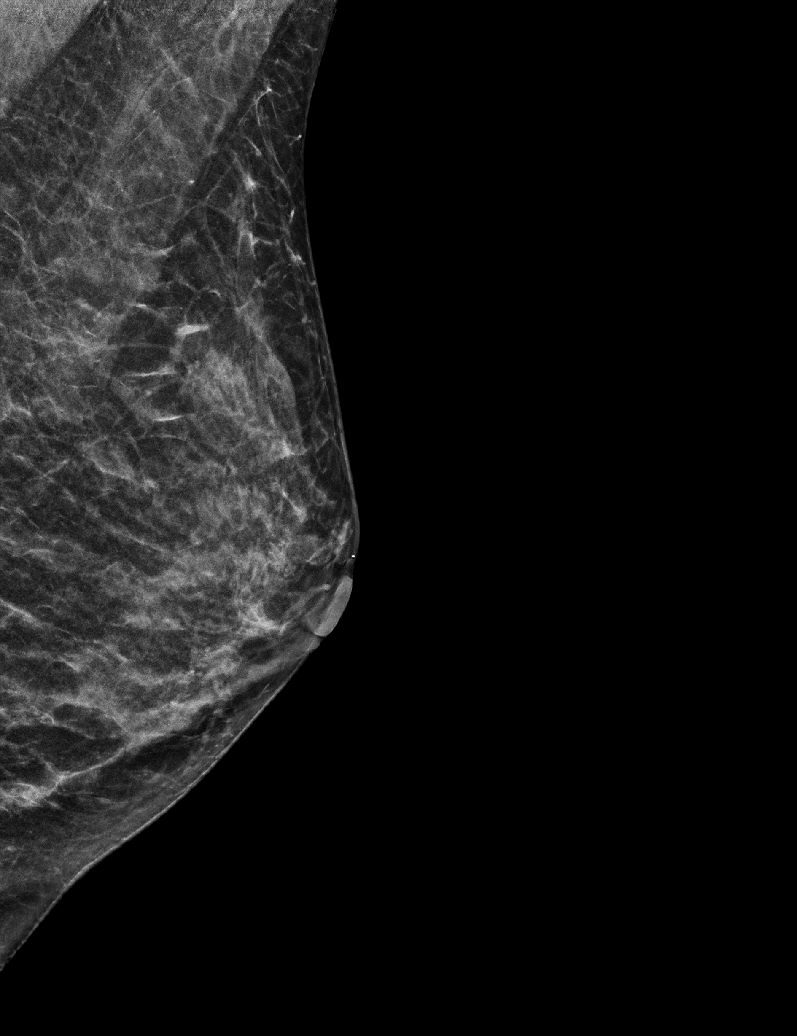

[L ML synth-2D]
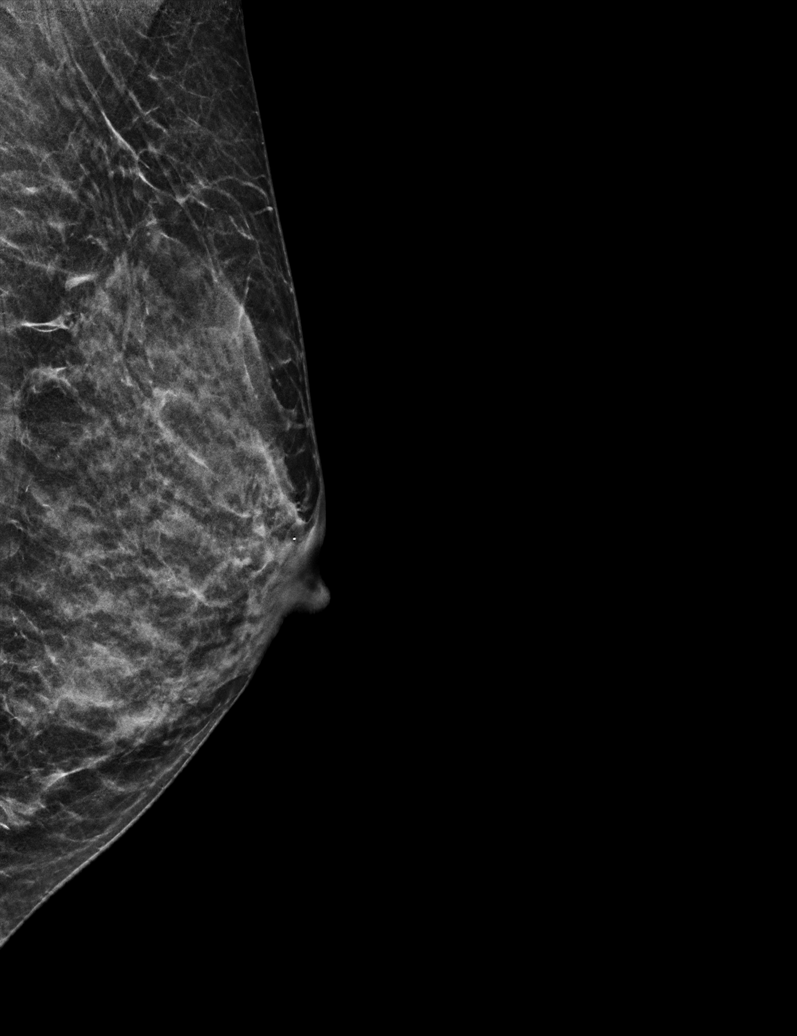

[L MLO synth-2D (2 of 2)]
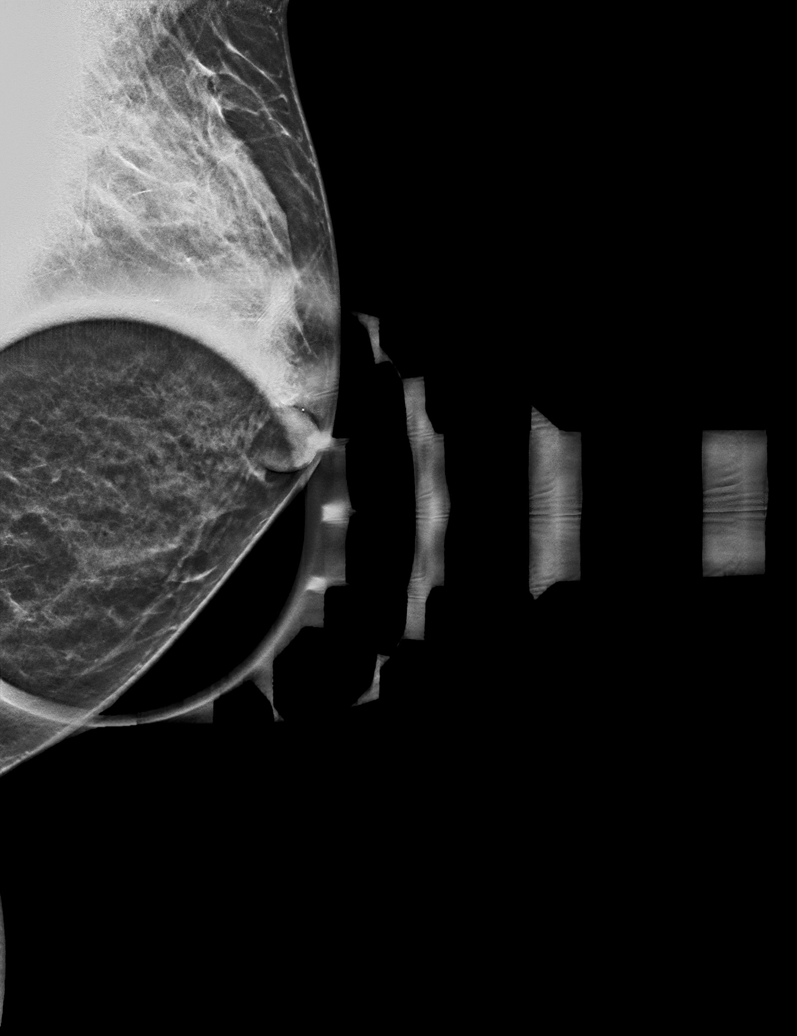

[L ML tomo · tomo slice 19/38.0]
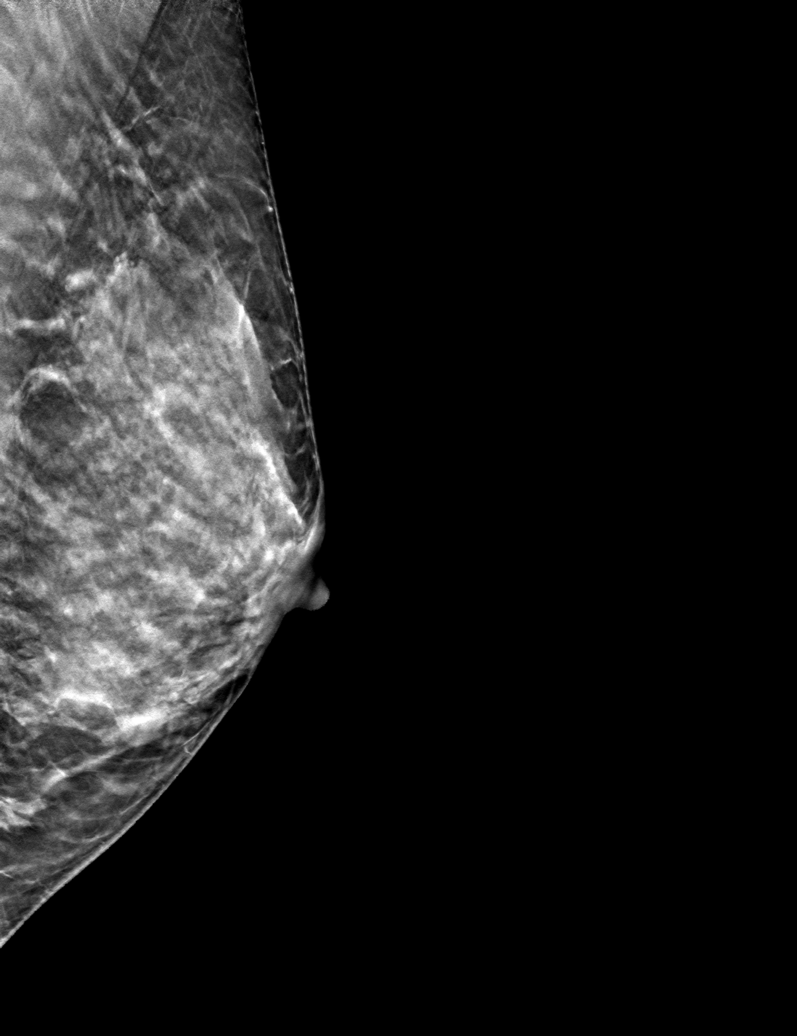

[L MLO tomo (1 of 2) · tomo slice 18/35.0]
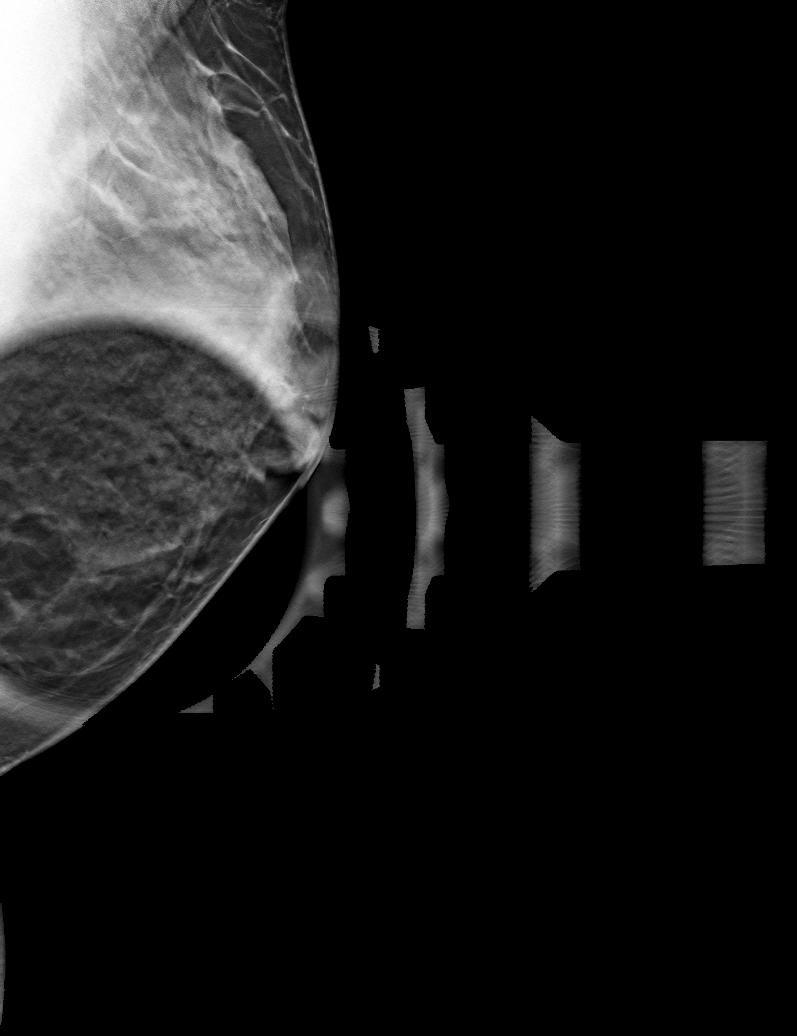

[L MLO tomo (2 of 2) · tomo slice 21/41.0]
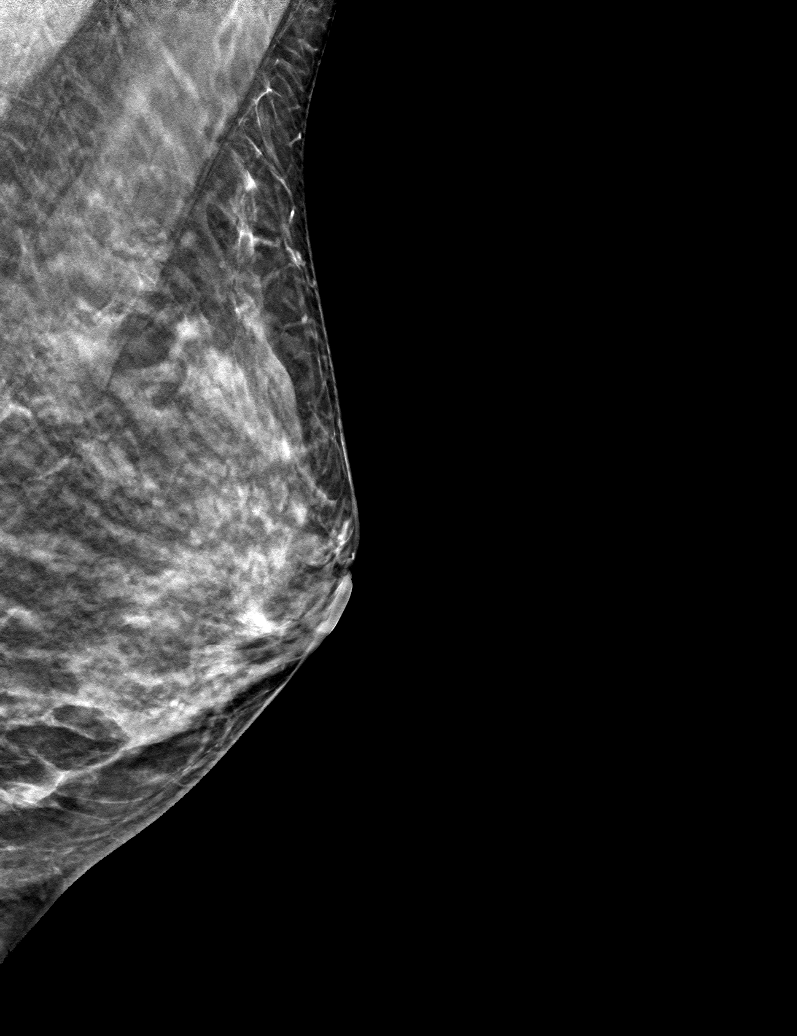

[6 of 18 positions shown; findings below may reference images not displayed]

ACR Breast Density Category d: The breast tissue is extremely dense,
which lowers the sensitivity of mammography.
FINDINGS: Spot compression tomosynthesis images through the inferior left
breast demonstrates no persistent suspicious abnormalities.
IMPRESSION: Resolution of the left breast asymmetry consistent with overlapping
fibroglandular tissue.

RECOMMENDATION:
Screening mammogram in one year.(Code:FP-L-5OS)

I have discussed the findings and recommendations with the patient.
If applicable, a reminder letter will be sent to the patient
regarding the next appointment.

BI-RADS CATEGORY  1: Negative.

## 2023-10-13 ENCOUNTER — Other Ambulatory Visit: Payer: Self-pay | Admitting: Family Medicine

## 2023-10-13 DIAGNOSIS — Z1231 Encounter for screening mammogram for malignant neoplasm of breast: Secondary | ICD-10-CM

## 2023-11-01 ENCOUNTER — Ambulatory Visit
Admission: RE | Admit: 2023-11-01 | Discharge: 2023-11-01 | Disposition: A | Discharge status: Home / Self Care | Source: Ambulatory Visit | Attending: Family Medicine | Admitting: Family Medicine

## 2023-11-01 DIAGNOSIS — Z1231 Encounter for screening mammogram for malignant neoplasm of breast: Secondary | ICD-10-CM | POA: Insufficient documentation

## 2024-02-22 ENCOUNTER — Encounter: Payer: Self-pay | Admitting: Internal Medicine

## 2024-02-22 ENCOUNTER — Ambulatory Visit: Admitting: Internal Medicine

## 2024-02-22 ENCOUNTER — Other Ambulatory Visit: Payer: Self-pay

## 2024-02-22 VITALS — BP 120/76 | HR 92 | Temp 98.2°F | Resp 16 | Ht 68.0 in | Wt 148.7 lb

## 2024-02-22 DIAGNOSIS — Z1211 Encounter for screening for malignant neoplasm of colon: Secondary | ICD-10-CM | POA: Diagnosis not present

## 2024-02-22 DIAGNOSIS — Z13220 Encounter for screening for lipoid disorders: Secondary | ICD-10-CM | POA: Diagnosis not present

## 2024-02-22 DIAGNOSIS — F419 Anxiety disorder, unspecified: Secondary | ICD-10-CM | POA: Insufficient documentation

## 2024-02-22 DIAGNOSIS — E559 Vitamin D deficiency, unspecified: Secondary | ICD-10-CM | POA: Diagnosis not present

## 2024-02-22 MED ORDER — VITAMIN D (ERGOCALCIFEROL) 1.25 MG (50000 UNIT) PO CAPS: 50000.0000 [IU] | ORAL_CAPSULE | ORAL | 0 refills | Status: AC

## 2024-02-22 MED ORDER — HYDROXYZINE HCL 10 MG PO TABS: 5.0000 mg | ORAL_TABLET | Freq: Every evening | ORAL | 0 refills | Status: AC | PRN

## 2024-02-22 NOTE — Progress Notes (Signed)
 New Patient Office Visit  Subjective    Patient ID: Tricia Small, female    DOB: 1975/10/11  Age: 48 y.o. MRN: [id number]  CC:  Chief Complaint  Patient presents with   Establish Care    HPI Tricia Small presents to establish care.   Discussed the use of AI scribe software for clinical note transcription with the patient, who gave verbal consent to proceed.  History of Present Illness Tricia Small is a 48 year old female with anxiety and OCD who presents with worsening anxiety and OCD symptoms.  She experiences a significant increase in anxiety and OCD symptoms, describing them as debilitating. She suspects a possible link to perimenopause. She prefers natural approaches over medications like Zoloft, which she used in her twenties.  Recent blood work shows elevated cholesterol at 202 mg/dL and low vitamin D  at 22.6 ng/mL. She was not fasting for the cholesterol test and has never had high cholesterol before.  A recent attempted identity theft has exacerbated her anxiety. Her symptoms intensify around her menstrual cycle, especially premenstrually. She experienced similar stress during a severe illness in 2020.  She is concerned about the impact of her mental health on caring for her three children and maintaining her job. She seeks options that avoid long-term medication due to concerns about side effects and dependency.  Health Maintenance: -Blood work UTD -Mammogram UTD 7/25 Birads-1 -Colon cancer screening due, referral placed -Pap 2017 normal  Outpatient Encounter Medications as of 02/22/2024  Medication Sig   docusate sodium (COLACE) 100 MG capsule  (Patient not taking: Reported on 02/22/2024)   ibuprofen (ADVIL,MOTRIN) 800 MG tablet Take 800 mg by mouth every 8 (eight) hours as needed. (Patient not taking: Reported on 02/22/2024)   Prenatal Multivit-Min-Fe-FA (PRENATAL VITAMINS PO) Take 1 tablet by mouth daily. (Patient not taking: Reported on 02/22/2024)    Prenatal Vit-Fe Fumarate-FA (PNV PRENATAL PLUS MULTIVITAMIN) 27-1 MG TABS Take by mouth. (Patient not taking: Reported on 02/22/2024)   No facility-administered encounter medications on file as of 02/22/2024.    Past Medical History:  Diagnosis Date   Anxiety    Blood in stool    after childbirth   Depression    Genital herpes    HPV (human papilloma virus) infection    Psoriasis     Past Surgical History:  Procedure Laterality Date   EXCISION VAGINAL CYST     HAND TENDON SURGERY     L thumb    Family History  Problem Relation Age of Onset   Alcohol abuse Father    Hypertension Father    Cancer Maternal Grandfather    Diabetes Maternal Grandmother    Cancer Paternal Grandmother    Diabetes Paternal Grandfather    Breast cancer Neg Hx     Social History   Socioeconomic History   Marital status: Married    Spouse name: Not on file   Number of children: Not on file   Years of education: Not on file   Highest education level: Bachelor's degree (e.g., BA, AB, BS)  Occupational History   Not on file  Tobacco Use   Smoking status: Former   Smokeless tobacco: Never  Vaping Use   Vaping status: Never Used  Substance and Sexual Activity   Alcohol use: Yes   Drug use: No   Sexual activity: Yes  Other Topics Concern   Not on file  Social History Narrative   Not on file   Social Drivers of Health   Financial  Resource Strain: Low Risk  (02/21/2024)   Overall Financial Resource Strain (CARDIA)    Difficulty of Paying Living Expenses: Not very hard  Food Insecurity: No Food Insecurity (02/21/2024)   Hunger Vital Sign    Worried About Running Out of Food in the Last Year: Never true    Ran Out of Food in the Last Year: Never true  Transportation Needs: No Transportation Needs (02/21/2024)   PRAPARE - Administrator, Civil Service (Medical): No    Lack of Transportation (Non-Medical): No  Physical Activity: Insufficiently Active (02/21/2024)   Exercise Vital  Sign    Days of Exercise per Week: 2 days    Minutes of Exercise per Session: 20 min  Stress: Stress Concern Present (02/21/2024)   [employer] of Occupational Health - Occupational Stress Questionnaire    Feeling of Stress: Very much  Social Connections: Socially Isolated (02/21/2024)   Social Connection and Isolation Panel    Frequency of Communication with Friends and Family: Never    Frequency of Social Gatherings with Friends and Family: Once a week    Attends Religious Services: Never    Database Administrator or Organizations: No    Attends Engineer, Structural: Not on file    Marital Status: Married  Catering Manager Violence: Not on file    Review of Systems  Psychiatric/Behavioral:  The patient is nervous/anxious.         Objective    Ht 5' 8 (1.727 m)   BMI 21.17 kg/m   Physical Exam Constitutional:      Appearance: Normal appearance.  HENT:     Head: Normocephalic and atraumatic.  Eyes:     Conjunctiva/sclera: Conjunctivae normal.  Cardiovascular:     Rate and Rhythm: Normal rate and regular rhythm.  Pulmonary:     Effort: Pulmonary effort is normal.     Breath sounds: Normal breath sounds.  Skin:    General: Skin is warm and dry.  Neurological:     General: No focal deficit present.     Mental Status: She is alert. Mental status is at baseline.  Psychiatric:        Mood and Affect: Mood is anxious.        Speech: Speech normal.        Behavior: Behavior normal.        Thought Content: Thought content normal.         Assessment & Plan:   Assessment & Plan Anxiety disorder and obsessive-compulsive disorder Chronic anxiety and OCD exacerbated, possibly due to perimenopausal symptoms. Discussed non-pharmacological interventions and potential medication options. - Referred to counselor for cognitive behavioral therapy. - Prescribed hydroxyzine 10 mg, instructed to take half tablet as needed at bedtime for acute anxiety. -  Discussed potential use of Prozac for premenstrual exacerbations after hydroxyzine trial. - Encouraged regular exercise for anxiety management.  Vitamin D  deficiency Vitamin D  level at 22.6 ng/mL, below normal. Discussed importance for bone, mood, and gastrointestinal health. - Prescribed high-dose vitamin D  weekly for 12 weeks. - Advised continuation of OTC vitamin D  (1000-2000 IU daily) post-treatment.  Hypercholesterolemia Total cholesterol at 202 mg/dL, LDL slightly elevated. Discussed diet, genetics, and exercise in management. - Ordered fasting lipid panel through [facility]. - Encouraged dietary modifications and regular cardiovascular exercise.  Perimenopausal symptoms (suspected) Suspected perimenopausal symptoms affecting anxiety and mood. Discussed hormonal changes and testing. - Ordered FSH and LH tests to assess hormonal status.  General Health Maintenance Discussed importance of  regular screenings and vaccinations. - Ensured referral to GI for colonoscopy screening.  - hydrOXYzine (ATARAX) 10 MG tablet; Take 0.5 tablets (5 mg total) by mouth at bedtime as needed for anxiety.  Dispense: 30 tablet; Refill: 0 - Ambulatory referral to Psychology - FSH/LH - Lipid Profile - Vitamin D , Ergocalciferol , (DRISDOL) 1.25 MG (50000 UNIT) CAPS capsule; Take 1 capsule (50,000 Units total) by mouth every 7 (seven) days.  Dispense: 12 capsule; Refill: 0 - Ambulatory referral to Gastroenterology   Return in about 4 weeks (around 03/21/2024).   Sharyle Fischer, DO

## 2024-03-25 ENCOUNTER — Ambulatory Visit: Admitting: Internal Medicine

## 2024-04-24 ENCOUNTER — Ambulatory Visit: Admitting: Internal Medicine
